# Patient Record
Sex: Male | Born: 1995 | Race: Black or African American | Hispanic: No | Marital: Single | Smoking: Current every day smoker
Health system: Southern US, Community
[De-identification: ages and names within clinical notes are randomized; demographics above are authoritative.]

## PROBLEM LIST (undated history)

## (undated) DIAGNOSIS — W3400XA Accidental discharge from unspecified firearms or gun, initial encounter: Secondary | ICD-10-CM

## (undated) DIAGNOSIS — S71139A Puncture wound without foreign body, unspecified thigh, initial encounter: Secondary | ICD-10-CM

## (undated) HISTORY — PX: LEG AMPUTATION: SHX1105

---
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
## 2018-03-30 ENCOUNTER — Emergency Department (HOSPITAL_COMMUNITY)
Admission: EM | Admit: 2018-03-30 | Discharge: 2018-03-30 | Disposition: A | Discharge status: Home / Self Care | Payer: Medicaid Other | Attending: Emergency Medicine | Admitting: Emergency Medicine

## 2018-03-30 ENCOUNTER — Encounter (HOSPITAL_COMMUNITY): Payer: Self-pay

## 2018-03-30 DIAGNOSIS — R112 Nausea with vomiting, unspecified: Secondary | ICD-10-CM | POA: Insufficient documentation

## 2018-03-30 DIAGNOSIS — R51 Headache: Secondary | ICD-10-CM | POA: Insufficient documentation

## 2018-03-30 DIAGNOSIS — F17210 Nicotine dependence, cigarettes, uncomplicated: Secondary | ICD-10-CM | POA: Diagnosis not present

## 2018-03-30 DIAGNOSIS — R0989 Other specified symptoms and signs involving the circulatory and respiratory systems: Secondary | ICD-10-CM | POA: Diagnosis not present

## 2018-03-30 DIAGNOSIS — R519 Headache, unspecified: Secondary | ICD-10-CM

## 2018-03-30 MED ORDER — SODIUM CHLORIDE 0.9 % IV BOLUS
1000.0000 mL | Freq: Once | INTRAVENOUS | Status: AC
  Administered 2018-03-30: 1000 mL via INTRAVENOUS

## 2018-03-30 MED ORDER — KETOROLAC TROMETHAMINE 15 MG/ML IJ SOLN
15.0000 mg | Freq: Once | INTRAMUSCULAR | Status: AC
Start: 2018-03-30 — End: 2018-03-30
  Administered 2018-03-30: 15 mg via INTRAVENOUS
  Filled 2018-03-30: qty 1

## 2018-03-30 MED ORDER — DIPHENHYDRAMINE HCL 50 MG/ML IJ SOLN
25.0000 mg | Freq: Once | INTRAMUSCULAR | Status: AC
  Administered 2018-03-30: 25 mg via INTRAVENOUS
  Filled 2018-03-30: qty 1

## 2018-03-30 MED ORDER — PROCHLORPERAZINE EDISYLATE 10 MG/2ML IJ SOLN
10.0000 mg | Freq: Once | INTRAMUSCULAR | Status: AC
  Administered 2018-03-30: 10 mg via INTRAVENOUS
  Filled 2018-03-30: qty 2

## 2018-03-30 NOTE — ED Notes (Signed)
Pts fluids are finished.

## 2018-03-30 NOTE — ED Provider Notes (Signed)
[facility] EMERGENCY DEPARTMENT Provider Note   CSN: [id number] Arrival date & time: 03/30/18  1222     History   Chief Complaint Chief Complaint  Patient presents with  . Headache    HPI Knox Cervi is a 22 y.o. male.  22 y/o male with no PMH presents to the ED via EMS complaining of HA which began a week and a half ago.Patient describes the headache as throbbing mainly in the frontal region of his head with no radiation.Patient also reports nausea and one episode of emesis. He also states he's had a runny nose for 2 days. Patient has tried aleve but states no relieve in symptoms. Patient denies any fever, photophobia, phonophobia, neck pain or previous history of migraines.      History reviewed. No pertinent past medical history.  There are no active problems to display for this patient.   History reviewed. No pertinent surgical history.      Home Medications    Prior to Admission medications   Not on File    Family History History reviewed. No pertinent family history.  Social History Social History   Tobacco Use  . Smoking status: Current Every Day Smoker    Packs/day: 0.10    Types: Cigarettes  . Smokeless tobacco: Never Used  Substance Use Topics  . Alcohol use: Never    Frequency: Never  . Drug use: Never     Allergies   Patient has no known allergies.   Review of Systems Review of Systems  Constitutional: Negative for fever.  HENT: Positive for rhinorrhea, sinus pressure and sinus pain. Negative for sneezing and sore throat.   Eyes: Negative for redness and visual disturbance.  Respiratory: Negative for chest tightness, shortness of breath and wheezing.   Cardiovascular: Negative for chest pain and palpitations.  Gastrointestinal: Negative for abdominal pain, diarrhea, nausea and vomiting.  Genitourinary: Negative for dysuria and flank pain.  Musculoskeletal: Negative for back pain, neck pain and neck stiffness.    Neurological: Positive for headaches. Negative for dizziness, syncope, facial asymmetry and light-headedness.  All other systems reviewed and are negative.    Physical Exam Updated Vital Signs BP (!) 117/57   Pulse 83   Temp 97.6 F (36.4 C) (Oral)   Resp 16   SpO2 100%   Physical Exam  Constitutional: He is oriented to person, place, and time. He appears well-developed and well-nourished.  HENT:  Head: Normocephalic and atraumatic.  Eyes: Pupils are equal, round, and reactive to light. EOM are normal. Pupils are equal.  Neck: Normal range of motion. No neck rigidity. No Kernig's sign noted.  Cardiovascular: Normal heart sounds.  Pulmonary/Chest: Effort normal and breath sounds normal. He has no wheezes.  Abdominal: Soft. Bowel sounds are normal. There is no tenderness.  Musculoskeletal: He exhibits no edema or tenderness.  Neurological: He is alert and oriented to person, place, and time. He has normal strength. Coordination normal.  Skin: Skin is warm and dry.  Nursing note and vitals reviewed.    ED Treatments / Results  Labs (all labs ordered are listed, but only abnormal results are displayed) Labs Reviewed - No data to display  EKG None  Radiology No results found.  Procedures Procedures (including critical care time)  Medications Ordered in ED Medications  sodium chloride 0.9 % bolus 1,000 mL (1,000 mLs Intravenous New Bag/Given 03/30/18 1347)  ketorolac (TORADOL) 15 MG/ML injection 15 mg (15 mg Intravenous Given 03/30/18 1347)  diphenhydrAMINE (BENADRYL) injection 25  mg (25 mg Intravenous Given 03/30/18 1347)  prochlorperazine (COMPAZINE) injection 10 mg (10 mg Intravenous Given 03/30/18 1347)     Initial Impression / Assessment and Plan / ED Course  I have reviewed the triage vital signs and the nursing notes.  Pertinent labs & imaging results that were available during my care of the patient were reviewed by me and considered in my medical decision  making (see chart for details).    Patient denies any fever, neck rigidity.No previous Hx of migraines.Patient neurologically intact, no facial asymmetry, no weakness. Patient denies trauma. Per [place] CT Head Rule patient does not qualify for imaging at this time.   Patient received pain medication and fluids.He states his headache is gone and he would like to go home. I have advised patient he will be discharged after fluids finish.Returend precautions were discussed with patient.  Final Clinical Impressions(s) / ED Diagnoses   Final diagnoses:  Acute nonintractable headache, unspecified headache type    ED Discharge Orders    None       Claude MangesSoto, Analeia Ismael, PA-C 03/30/18 1433    Margarita Grizzleay, Danielle, MD 03/31/18 [phone]

## 2018-03-30 NOTE — Discharge Instructions (Signed)
Please continue to hydrate with fluids and Gatorade. Return to the ED if you experience any fever, chest pain or shortness of breath.

## 2018-03-30 NOTE — ED Notes (Signed)
Pt states he has had a headache for a week with no relief from Aleve.

## 2018-03-30 NOTE — ED Triage Notes (Signed)
To triage via EMS.  Pt was walking to hospital when he had to stop and call for EMS.  Onset 1 1/2 weeks headache, throbbing, and worsening.  Taking Advil with no relief.  No cold/cough symptoms.  Vomited x 1 yesterday.

## 2018-04-01 ENCOUNTER — Emergency Department (HOSPITAL_COMMUNITY)
Admission: EM | Admit: 2018-04-01 | Discharge: 2018-04-01 | Disposition: A | Discharge status: Home / Self Care | Payer: Medicaid Other | Attending: Emergency Medicine | Admitting: Emergency Medicine

## 2018-04-01 ENCOUNTER — Encounter (HOSPITAL_COMMUNITY): Payer: Self-pay | Admitting: Emergency Medicine

## 2018-04-01 ENCOUNTER — Emergency Department (HOSPITAL_COMMUNITY): Payer: Medicaid Other

## 2018-04-01 DIAGNOSIS — F17210 Nicotine dependence, cigarettes, uncomplicated: Secondary | ICD-10-CM | POA: Diagnosis not present

## 2018-04-01 DIAGNOSIS — R51 Headache: Secondary | ICD-10-CM | POA: Diagnosis present

## 2018-04-01 DIAGNOSIS — J0110 Acute frontal sinusitis, unspecified: Secondary | ICD-10-CM | POA: Insufficient documentation

## 2018-04-01 MED ORDER — DEXAMETHASONE SODIUM PHOSPHATE 10 MG/ML IJ SOLN
10.0000 mg | Freq: Once | INTRAMUSCULAR | Status: AC
  Administered 2018-04-01: 10 mg via INTRAMUSCULAR
  Filled 2018-04-01: qty 1

## 2018-04-01 MED ORDER — IBUPROFEN 600 MG PO TABS: 600.0000 mg | ORAL_TABLET | Freq: Four times a day (QID) | ORAL | 0 refills | Status: AC | PRN

## 2018-04-01 MED ORDER — AMOXICILLIN-POT CLAVULANATE 875-125 MG PO TABS: 1.0000 | ORAL_TABLET | Freq: Two times a day (BID) | ORAL | 0 refills | Status: AC

## 2018-04-01 MED ORDER — FLUTICASONE PROPIONATE 50 MCG/ACT NA SUSP: 1.0000 | Freq: Every day | NASAL | 0 refills | Status: AC

## 2018-04-01 NOTE — ED Triage Notes (Signed)
Per [facility] pt from home for headache that started 2 weeks ago. Reports was [facility] for same complaint. Pt has swelling on forehead. Sinus complaints for several days. Pt's mother told EMS she believes it is meningitis.

## 2018-04-01 NOTE — Discharge Instructions (Addendum)
He was seen in the emergency department today for a headache and found to have acute sinusitis, otherwise noted a sinus infection.  Please see the attached handout for further information regarding this diagnosis.  We are treating this infection with Augmentin, this is an antibiotic, please take this 2 times per day for the next 7 days.  Please take all of your antibiotics until finished. You may develop abdominal discomfort or diarrhea from the antibiotic.  You may help offset this with probiotics which you can buy at the store (ask your pharmacist if unable to find) or get probiotics in the form of eating yogurt. Do not eat or take the probiotics until 2 hours after your antibiotic. If you are unable to tolerate these side effects follow-up with your primary care provider or return to the emergency department.   If you begin to experience any blistering, rashes, swelling, or difficulty breathing seek medical care for evaluation of potentially more serious side effects.   Please be aware that this medication may interact with other medications you are taking, please be sure to discuss your medication list with your pharmacist.   We are also sending you with a prescription for Flonase, this is an intranasal steroid, use this daily in both nostrils.  We also sending you with prescription for ibuprofen, you may take this every 6 hours as needed for pain.  Do not take other NSAIDs such as Motrin, Aleve, Advil, or naproxen with this medication as they are similar.  You may take Tylenol per over-the-counter dosing instructions.   Please follow-up with your primary care provider within 3 to 5 days for reevaluation, if you do not have a primary care provider you may call the number listed in your discharge instructions that is circled.  Return to the ER at anytime for new or worsening symptoms including but not limited to worsening pain, fever, pain with eye movements, inability to move your neck, or any other  concerns that you may have.

## 2018-04-01 NOTE — ED Provider Notes (Signed)
[facility]-EMERGENCY DEPT Provider Note   CSN: [id number] Arrival date & time: 04/01/18  1159     History   Chief Complaint Chief Complaint  Patient presents with  . Headache    HPI Clinton White is a 22 y.o. male with a hx of tobacco abuse who returns to the ED via EMS for headache which has been ongoing for 2 weeks. Patient describes the headache as being in the frontal region, throbbing in nature, and constant.  Patient states he wakes up each morning with a headache being mild and it gradually worsens throughout the day.  He states that he feels his forehead is somewhat swollen at times.  He is having sinus pain/pressure, congestion, and rhinorrhea.  He does report a little bit of nausea with one episode of vomiting several days ago, none at present.  He reports he is tried Aleve at home without significant relief.  He was seen in the emergency department for similar yesterday, had improvement with migraine cocktail, however this morning headache returned.  Denies fever, numbness, weakness, dizziness, change in vision, neck pain/stiffness, or abdominal pain.  Denies recent tick exposures or rashes.  HPI  History reviewed. No pertinent past medical history.  There are no active problems to display for this patient.   History reviewed. No pertinent surgical history.      Home Medications    Prior to Admission medications   Medication Sig Start Date End Date Taking? Authorizing Provider  ibuprofen (ADVIL,MOTRIN) 200 MG tablet Take 200-400 mg by mouth daily as needed for headache.   Yes [provider]    Family History No family history on file.  Social History Social History   Tobacco Use  . Smoking status: Current Every Day Smoker    Packs/day: 0.10    Types: Cigarettes  . Smokeless tobacco: Never Used  Substance Use Topics  . Alcohol use: Not Currently    Frequency: Never  . Drug use: Yes    Types: Marijuana    Comment:  everyday      Allergies   Patient has no known allergies.   Review of Systems Review of Systems  Constitutional: Negative for chills and fever.  HENT: Positive for congestion, facial swelling (forehead), rhinorrhea, sinus pressure and sinus pain. Negative for ear pain and sore throat.   Eyes: Negative for visual disturbance.  Respiratory: Negative for cough and shortness of breath.   Cardiovascular: Negative for chest pain.  Gastrointestinal: Positive for nausea (none at present) and vomiting (one episode). Negative for abdominal pain.  Neurological: Positive for headaches. Negative for dizziness, seizures, syncope, weakness and numbness.  All other systems reviewed and are negative.   Physical Exam Updated Vital Signs BP 125/70 (BP Location: Right Arm)   Pulse 78   Temp 98.3 F (36.8 C) (Oral)   Resp 18   Ht 6\' 2"  (1.88 m)   Wt 70.8 kg (156 lb)   SpO2 99%   BMI 20.03 kg/m   Physical Exam  Constitutional: He appears well-developed and well-nourished.  Non-toxic appearance. No distress.  HENT:  Head: Normocephalic and atraumatic. Head is without raccoon's eyes and without Battle's sign.  Right Ear: Tympanic membrane and ear canal normal. No mastoid tenderness. Tympanic membrane is not perforated, not erythematous, not retracted and not bulging.  Left Ear: Tympanic membrane and ear canal normal. No mastoid tenderness. Tympanic membrane is not perforated, not erythematous, not retracted and not bulging.  Nose: Mucosal edema (congestion) present. Right sinus exhibits  frontal sinus tenderness. Right sinus exhibits no maxillary sinus tenderness. Left sinus exhibits frontal sinus tenderness. Left sinus exhibits no maxillary sinus tenderness.  Mouth/Throat: Uvula is midline and oropharynx is clear and moist. No oropharyngeal exudate or posterior oropharyngeal erythema.  No appreciable swelling to the forehead  Eyes: Pupils are equal, round, and reactive to light. Conjunctivae and  EOM are normal. Right eye exhibits no discharge. Left eye exhibits no discharge.  No periorbital erythema or edema.  No proptosis.  Neck: Normal range of motion and full passive range of motion without pain. Neck supple. No neck rigidity.  Cardiovascular: Normal rate and regular rhythm.  No murmur heard. Pulmonary/Chest: Breath sounds normal. No respiratory distress. He has no wheezes. He has no rales.  Abdominal: Soft. He exhibits no distension. There is no tenderness.  Lymphadenopathy:    He has no cervical adenopathy.  Neurological: He is alert.   Clear speech. No facial droop. CNIII-XII grossly intact. Bilateral upper and lower extremities' sensation grossly intact. 5/5 symmetric strength with grip strength and with plantar and dorsi flexion bilaterally. Patellar DTRs are 2+ and symmetric . Normal finger to nose bilaterally. Negative pronator drift. Negative Romberg sign. Gait is steady and intact.   Skin: Skin is warm and dry. No rash noted.  Psychiatric: He has a normal mood and affect. His behavior is normal. Thought content normal.  Nursing note and vitals reviewed.    ED Treatments / Results  Labs (all labs ordered are listed, but only abnormal results are displayed) Labs Reviewed - No data to display  EKG None  Radiology Ct Head Wo Contrast  Result Date: 04/01/2018 CLINICAL DATA:  Headache for 2 weeks. Forehead swelling. Sinus pain. EXAM: CT HEAD WITHOUT CONTRAST TECHNIQUE: Contiguous axial images were obtained from the base of the skull through the vertex without intravenous contrast. COMPARISON:  None. FINDINGS: BRAIN: No intraparenchymal hemorrhage, mass effect nor midline shift. The ventricles and sulci are normal. No acute large vascular territory infarcts. No abnormal extra-axial fluid collections. Basal cisterns are patent. VASCULAR: Unremarkable. SKULL/SOFT TISSUES: No skull fracture. No significant soft tissue swelling. ORBITS/SINUSES: The included ocular globes and  orbital contents are normal.Soft tissue completely opacifies the frontal sinuses, intact inner and outer tables. Moderate LEFT greater than RIGHT ethmoid mucosal thickening. Pneumatized petrous apices, well aerated mastoid air cells. OTHER: None. IMPRESSION: 1. Normal noncontrast CT HEAD. 2. Severe frontal sinusitis. Electronically Signed   By: Awilda Metroourtnay  Bloomer M.D.   On: 04/01/2018 18:35    Procedures Procedures (including critical care time)  Medications Ordered in ED Medications  dexamethasone (DECADRON) injection 10 mg (has no administration in time range)    Initial Impression / Assessment and Plan / ED Course  I have reviewed the triage vital signs and the nursing notes.  Pertinent labs & imaging results that were available during my care of the patient were reviewed by me and considered in my medical decision making (see chart for details).   Patient presents to the emergency department with complaint of headache and associated sinus pain/pressure and congestion.  Patient nontoxic-appearing, no apparent distress, vitals WNL.  Patient does have frontal sinus tenderness to palpation with mucosal edema on exam raising suspicion for acute frontal sinusitis as etiology.  Headache had gradual onset with steady progression. Patient is without focal neuro deficits, dizziness, change in vision, proptosis non concerning for [facility], ICH, ischemic CVA, dural venous sinus thrombosis, acute glaucoma, or giant cell arteritis. CT scan obtained given has not necessarily had a hx  of similar headache and patient concern- this was negative for acute intra-cranial abnormalities, there are findings that confirm severe frontal sinusitis. EOMI, no proptosis, no periorbital edema/erythema to raise concern for orbital cellulitis. No meningeal signs on exam. Decadron in the ER, discharge home with Augmentin, Flonase, and Ibuprofen. I discussed results, treatment plan, need for PCP follow-up, and return precautions with  the patient. Provided opportunity for questions, patient confirmed understanding and is in agreement with plan.   Findings and plan of care discussed with supervising physician Dr.Isaacs who is in agreement with plan.   Final Clinical Impressions(s) / ED Diagnoses   Final diagnoses:  Acute non-recurrent frontal sinusitis    ED Discharge Orders        Ordered    amoxicillin-clavulanate (AUGMENTIN) 875-125 MG tablet  Every 12 hours     04/01/18 1900    fluticasone (FLONASE) 50 MCG/ACT nasal spray  Daily     04/01/18 1900    ibuprofen (ADVIL,MOTRIN) 600 MG tablet  Every 6 hours PRN     04/01/18 1900       Jamine Highfill, Pleas Koch, PA-C 04/01/18 2315    Shaune Pollack, MD 04/02/18 0004

## 2018-06-07 ENCOUNTER — Telehealth: Payer: Self-pay | Admitting: *Deleted

## 2018-06-07 NOTE — Telephone Encounter (Signed)
Post ED Visit - Positive Culture Follow-up  Culture report reviewed by antimicrobial stewardship pharmacist:  []  Enzo BiNathan Batchelder, Pharm.D. []  Celedonio MiyamotoJeremy Frens, Pharm.D., BCPS AQ-ID []  Garvin FilaMike Maccia, Pharm.D., BCPS []  Georgina PillionElizabeth Martin, Pharm.D., BCPS [x]  ChaparralMinh Pham, [address] BoulevardPharm.D., BCPS, AAHIVP []  Estella HuskMichelle Turner, Pharm.D., BCPS, AAHIVP []  Lysle Pearlachel Rumbarger, PharmD, BCPS []  Phillips Climeshuy Dang, PharmD, BCPS []  Agapito GamesAlison Masters, PharmD, BCPS []  Verlan FriendsErin Deja, PharmD  Positive wound culture Treated with Sulfamethoxazole-Trimethoprim, organism sensitive to the same and no further patient follow-up is required at this time.  Virl AxeRobertson, Lachandra Dettmann [facility] 06/07/2018, 11:21 AM

## 2019-03-08 ENCOUNTER — Emergency Department (HOSPITAL_COMMUNITY)
Admission: EM | Admit: 2019-03-08 | Discharge: 2019-03-08 | Disposition: A | Discharge status: Home / Self Care | Payer: Medicaid Other | Attending: Emergency Medicine | Admitting: Emergency Medicine

## 2019-03-08 DIAGNOSIS — Z433 Encounter for attention to colostomy: Secondary | ICD-10-CM | POA: Diagnosis present

## 2019-03-08 DIAGNOSIS — R300 Dysuria: Secondary | ICD-10-CM | POA: Diagnosis not present

## 2019-03-08 DIAGNOSIS — F17210 Nicotine dependence, cigarettes, uncomplicated: Secondary | ICD-10-CM | POA: Diagnosis not present

## 2019-03-08 LAB — URINALYSIS, ROUTINE W REFLEX MICROSCOPIC
Bilirubin Urine: NEGATIVE
Glucose, UA: NEGATIVE mg/dL
Hgb urine dipstick: NEGATIVE
Ketones, ur: NEGATIVE mg/dL
Leukocytes,Ua: NEGATIVE
Nitrite: NEGATIVE
Protein, ur: NEGATIVE mg/dL
Specific Gravity, Urine: 1.021 (ref 1.005–1.030)
pH: 6 (ref 5.0–8.0)

## 2019-03-08 NOTE — ED Triage Notes (Signed)
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 Pt states he is out of bags and ordered some more colostomy bags but he will not have them for another "3-4 days."

## 2019-03-08 NOTE — Discharge Instructions (Addendum)
Colostomy bags were provided today during your visit.  Your urine showed no signs of infection, it was sent for culture if any organism is to grow will call for antibiotic therapy.  Please follow-up with your primary care physician.  If you do not have a primary care physician the [facility] health and wellness clinic's number attached to the your chart.

## 2019-03-08 NOTE — ED Provider Notes (Signed)
[place] EMERGENCY DEPARTMENT Provider Note   CSN: [id number] Arrival date & time: 03/08/19  1950    History   Chief Complaint Chief Complaint  Patient presents with  . needs colostomy bag    HPI Clinton White is a 23 y.o. male.     23 y.o male with a PMH of open wound to the abdomen presents to the ED requesting a a colostomy bag replacement. 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 He reports running out of his colostomy bag and will not be getting a replacement for 3-4 days. Patient also reports dysuria, states this began a couple of days ago. He denies any fever or other complaints.          Home Medications    Prior to Admission medications   Medication Sig Start Date End Date Taking? Authorizing Provider  amoxicillin-clavulanate (AUGMENTIN) 875-125 MG tablet Take 1 tablet by mouth every 12 (twelve) hours. 04/01/18   Petrucelli, Samantha R, PA-C  fluticasone (FLONASE) 50 MCG/ACT nasal spray Place 1 spray into both nostrils daily. 04/01/18   Petrucelli, Samantha R, PA-C  ibuprofen (ADVIL,MOTRIN) 600 MG tablet Take 1 tablet (600 mg total) by mouth every 6 (six) hours as needed. 04/01/18   Petrucelli, Glynda Jaeger, PA-C    Family History No family history on file.  Social History Social History   Tobacco Use  . Smoking status: Current Every Day Smoker    Packs/day: 0.10    Types: Cigarettes  . Smokeless tobacco: Never Used  Substance Use Topics  . Alcohol use: Not Currently    Frequency: Never  . Drug use: Yes    Types: Marijuana    Comment: everyday      Allergies   Patient has no known allergies.   Review of Systems Review of Systems  Constitutional: Negative for fever.  Genitourinary: Positive for dysuria. Negative for flank pain, genital sores, hematuria, penile swelling, scrotal swelling and testicular pain.  Skin: Negative for pallor and wound.     Physical Exam  Updated Vital Signs BP 122/76 (BP Location: Right Arm)   Pulse 82   Temp 97.7 F (36.5 C) (Oral)   Resp 16   SpO2 99%   Physical Exam Vitals signs and nursing note reviewed.  Constitutional:      Appearance: He is well-developed.  HENT:     Head: Normocephalic and atraumatic.  Eyes:     General: No scleral icterus.    Pupils: Pupils are equal, round, and reactive to light.  Neck:     Musculoskeletal: Normal range of motion.  Cardiovascular:     Rate and Rhythm: Normal rate and regular rhythm.     Heart sounds: Normal heart sounds.  Pulmonary:     Effort: Pulmonary effort is normal.     Breath sounds: Normal breath sounds. No wheezing.  Chest:     Chest wall: No tenderness.  Abdominal:     General: Bowel sounds are normal. There is no distension.     Palpations: Abdomen is soft.     Tenderness: There is no abdominal tenderness.       Comments: No erythema, changes in skin.  Musculoskeletal:        General: No tenderness or deformity.  Skin:    General: Skin is warm and dry.  Neurological:     Mental Status: He is alert and oriented to person, place, and time.  ED Treatments / Results  Labs (all labs ordered are listed, but only abnormal results are displayed) Labs Reviewed  URINE CULTURE  URINALYSIS, ROUTINE W REFLEX MICROSCOPIC    EKG None  Radiology No results found.  Procedures Procedures (including critical care time)  Medications Ordered in ED Medications - No data to display   Initial Impression / Assessment and Plan / ED Course  I have reviewed the triage vital signs and the nursing notes.  Pertinent labs & imaging results that were available during my care of the patient were reviewed by me and considered in my medical decision making (see chart for details).      Patient with a past medical history of open wound to the abdomen presents to the ED requesting ileostomy supplies.  [facility], he has  run out of ileostomy bags, reports he will not be receiving them for 3 to 4 days.  During evaluation patient is well-appearing, site is identified without erythema, other changes.  Will obtain supplies for patient, as I was walking out the door patient reported urinary symptoms, reports some dysuria and discomfort for the past couple days.  According to his medical records he has a previous history of UTIs.  Will obtain UA along with culture to further evaluate.  UA showed no nitrites, leukocytes, white blood cell count.  Urine was sent for culture.  Patient informed of his results.  Patient [place], provided with the [place] and wellness number.  Return precautions provided at length.     Portions of this note were generated with Scientist, clinical (histocompatibility and immunogenetics)Dragon dictation software. Dictation errors may occur despite best attempts at proofreading.   Final Clinical Impressions(s) / ED Diagnoses   Final diagnoses:  Colostomy care [facility](HCC)    ED Discharge Orders    None       Claude MangesSoto, Blanca Thornton, PA-C 03/08/19 1144    Gerhard MunchLockwood, Robert, MD 03/08/19 1336

## 2019-03-09 LAB — URINE CULTURE: Culture: NO GROWTH

## 2019-04-13 ENCOUNTER — Encounter (HOSPITAL_COMMUNITY): Payer: Self-pay | Admitting: Emergency Medicine

## 2019-04-13 ENCOUNTER — Emergency Department (HOSPITAL_COMMUNITY)
Admission: EM | Admit: 2019-04-13 | Discharge: 2019-04-13 | Disposition: A | Discharge status: Home / Self Care | Payer: Medicaid Other | Attending: Emergency Medicine | Admitting: Emergency Medicine

## 2019-04-13 ENCOUNTER — Other Ambulatory Visit: Payer: Self-pay

## 2019-04-13 DIAGNOSIS — X58XXXA Exposure to other specified factors, initial encounter: Secondary | ICD-10-CM | POA: Insufficient documentation

## 2019-04-13 DIAGNOSIS — T148XXA Other injury of unspecified body region, initial encounter: Secondary | ICD-10-CM

## 2019-04-13 DIAGNOSIS — F17210 Nicotine dependence, cigarettes, uncomplicated: Secondary | ICD-10-CM | POA: Insufficient documentation

## 2019-04-13 DIAGNOSIS — Y9289 Other specified places as the place of occurrence of the external cause: Secondary | ICD-10-CM | POA: Diagnosis not present

## 2019-04-13 DIAGNOSIS — M25552 Pain in left hip: Secondary | ICD-10-CM | POA: Diagnosis present

## 2019-04-13 DIAGNOSIS — Y9389 Activity, other specified: Secondary | ICD-10-CM | POA: Insufficient documentation

## 2019-04-13 DIAGNOSIS — Y999 Unspecified external cause status: Secondary | ICD-10-CM | POA: Insufficient documentation

## 2019-04-13 DIAGNOSIS — S70222A Blister (nonthermal), left hip, initial encounter: Secondary | ICD-10-CM | POA: Diagnosis not present

## 2019-04-13 HISTORY — DX: Puncture wound without foreign body, unspecified thigh, initial encounter: S71.139A

## 2019-04-13 LAB — CBC WITH DIFFERENTIAL/PLATELET
Abs Immature Granulocytes: 0.01 10*3/uL (ref 0.00–0.07)
Basophils Absolute: 0 10*3/uL (ref 0.0–0.1)
Basophils Relative: 1 %
Eosinophils Absolute: 0.2 10*3/uL (ref 0.0–0.5)
Eosinophils Relative: 3 %
HCT: 44.2 % (ref 39.0–52.0)
Hemoglobin: 14.5 g/dL (ref 13.0–17.0)
Immature Granulocytes: 0 %
Lymphocytes Relative: 51 %
Lymphs Abs: 2.6 10*3/uL (ref 0.7–4.0)
MCH: 31.5 pg (ref 26.0–34.0)
MCHC: 32.8 g/dL (ref 30.0–36.0)
MCV: 96.1 fL (ref 80.0–100.0)
Monocytes Absolute: 0.3 10*3/uL (ref 0.1–1.0)
Monocytes Relative: 6 %
Neutro Abs: 2 10*3/uL (ref 1.7–7.7)
Neutrophils Relative %: 39 %
Platelets: 188 10*3/uL (ref 150–400)
RBC: 4.6 MIL/uL (ref 4.22–5.81)
RDW: 12.7 % (ref 11.5–15.5)
WBC: 5.2 10*3/uL (ref 4.0–10.5)
nRBC: 0 % (ref 0.0–0.2)

## 2019-04-13 LAB — BASIC METABOLIC PANEL
Anion gap: 10 (ref 5–15)
BUN: 7 mg/dL (ref 6–20)
CO2: 22 mmol/L (ref 22–32)
Calcium: 8.9 mg/dL (ref 8.9–10.3)
Chloride: 106 mmol/L (ref 98–111)
Creatinine, Ser: 1.11 mg/dL (ref 0.61–1.24)
GFR calc Af Amer: >60 mL/min (ref >60)
GFR calc non Af Amer: >60 mL/min (ref >60)
Glucose, Bld: 91 mg/dL (ref 70–99)
Potassium: 3.4 mmol/L — ABNORMAL LOW (ref 3.5–5.1)
Sodium: 138 mmol/L (ref 135–145)
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
## 2019-04-13 NOTE — ED Triage Notes (Signed)
Pt states he is 7/10 hip pain, pt states it feel a lot of pressure there and he believe is a blood cloth on his incision.

## 2019-12-25 IMAGING — CT CT HEAD W/O CM
3 series · 15 of 47 positions shown, 18 images · non-contrast
Comparison: None.

CLINICAL DATA: Headache for 2 weeks. Forehead swelling. Sinus pain.

EXAM:
CT HEAD WITHOUT CONTRAST
TECHNIQUE: Contiguous axial images were obtained from the base of the skull
through the vertex without intravenous contrast.

[Series 2: head wo · axial · 0.47mm/px · z∈[-109,+16]mm · 9 of 30 slices shown, 12 images]
[im 3/30  brain]
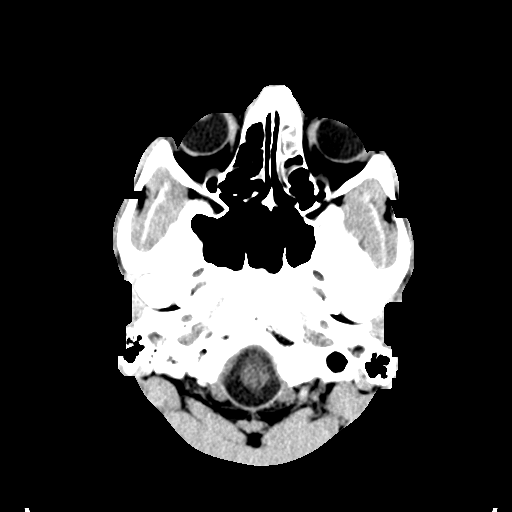
[im 3/30  bone]
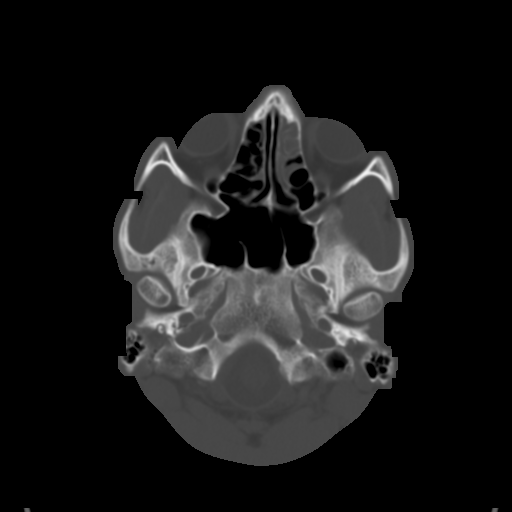
[im 6/30  brain]
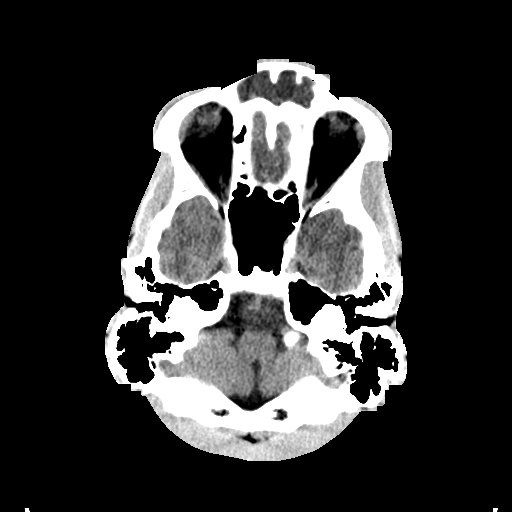
[im 9/30  brain]
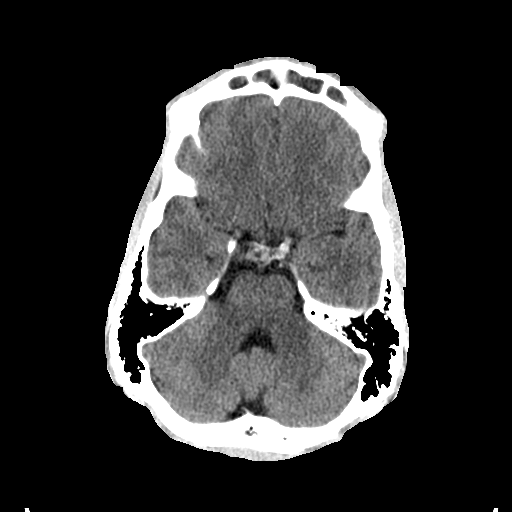
[im 12/30  brain]
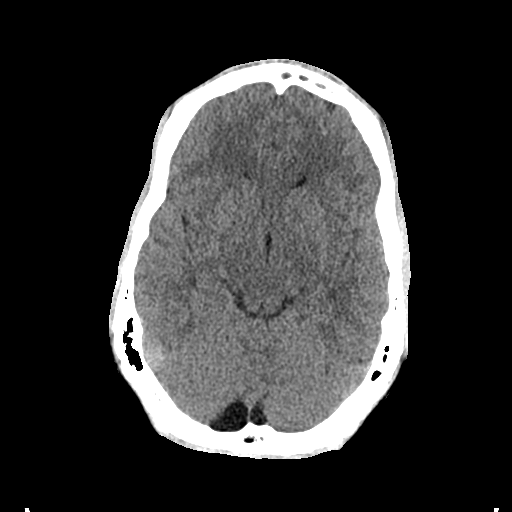
[im 16/30  brain]
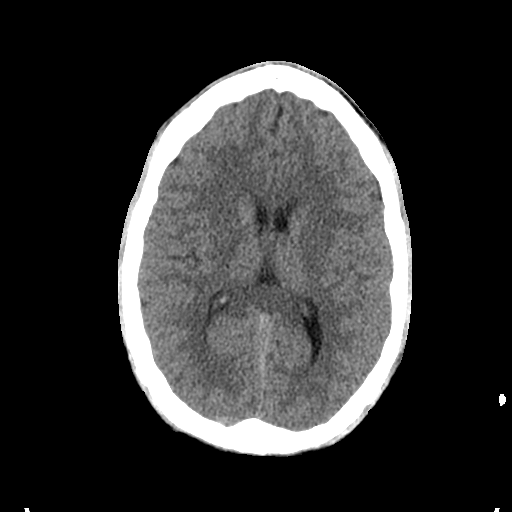
[im 16/30  bone]
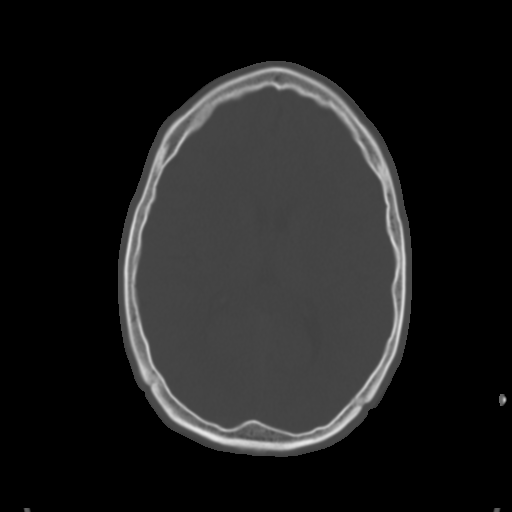
[im 19/30  brain]
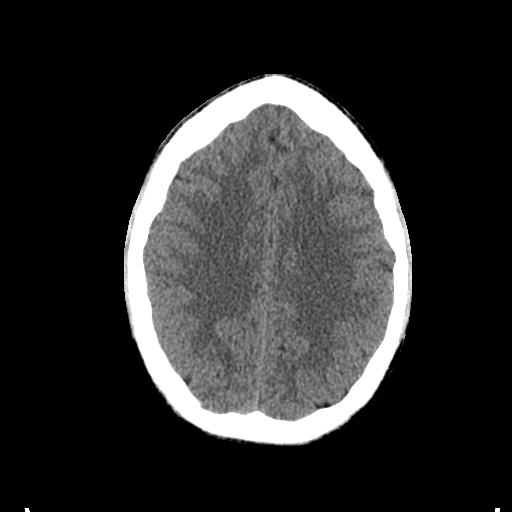
[im 22/30  brain]
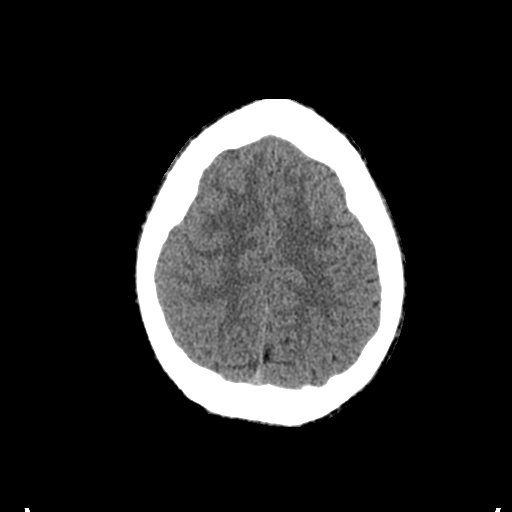
[im 25/30  brain]
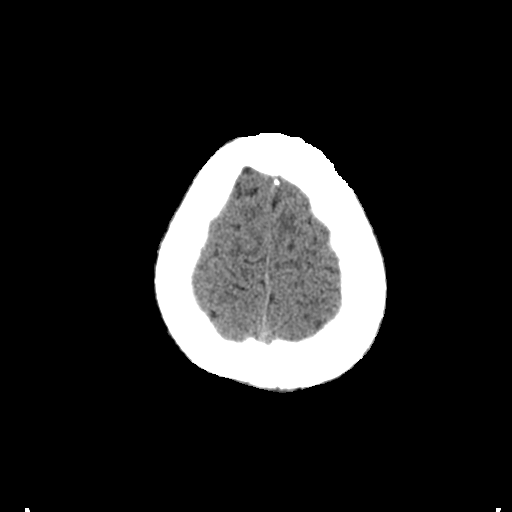
[im 28/30  brain]
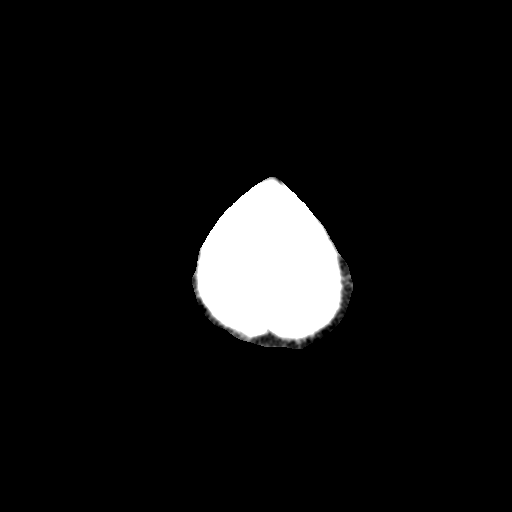
[im 28/30  bone]
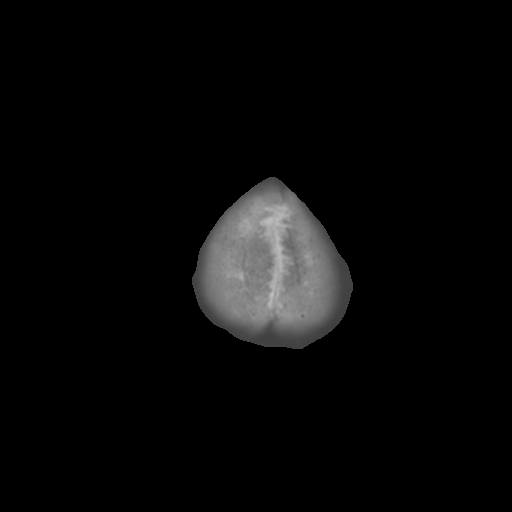

[Series 5: coronal soft tissue · coronal · 0.29mm/px · 3 of 70 slices shown]
[im 24/70  brain]
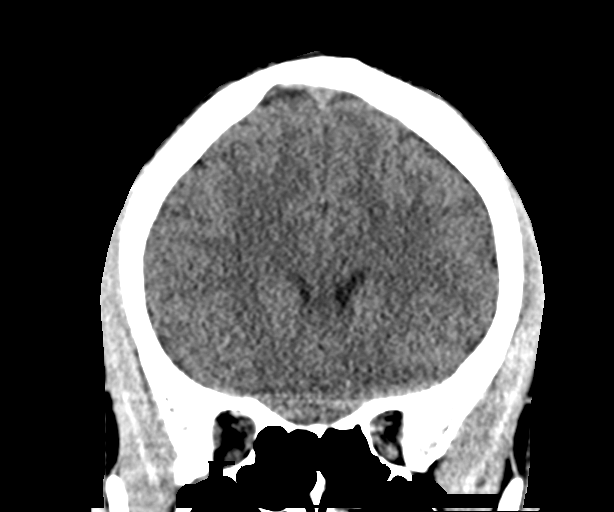
[im 31/70  brain]
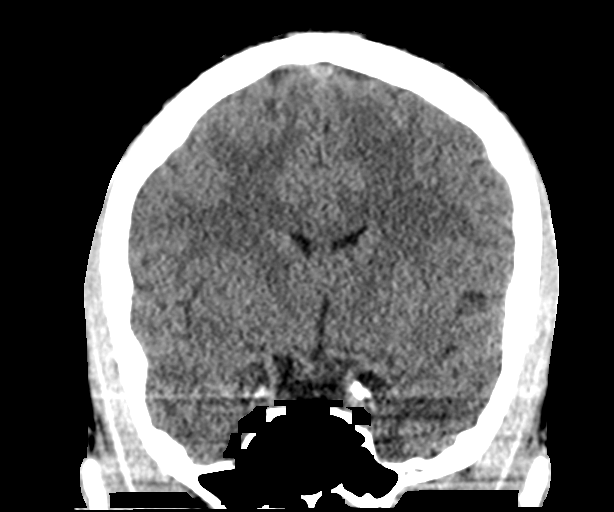
[im 39/70  brain]
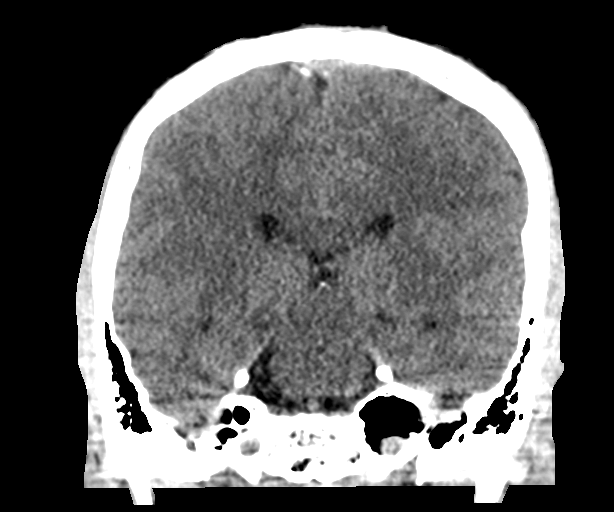

[Series 6: sagittal soft tissue · sagittal · 0.29mm/px · 3 of 61 slices shown]
[im 21/61  brain]
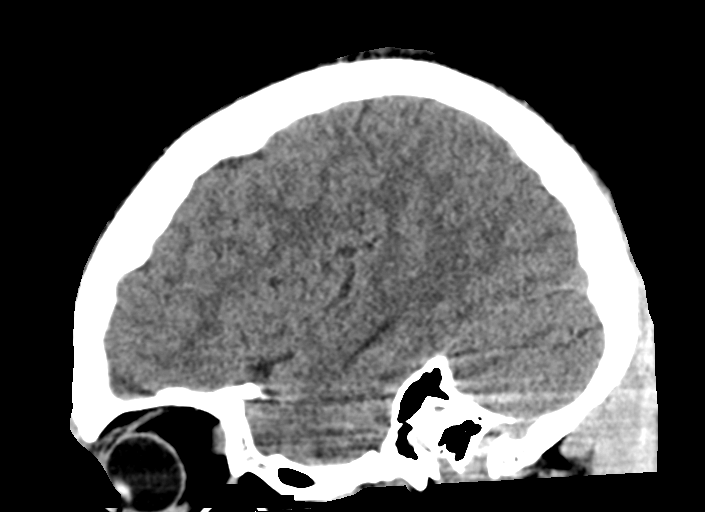
[im 31/61  brain]
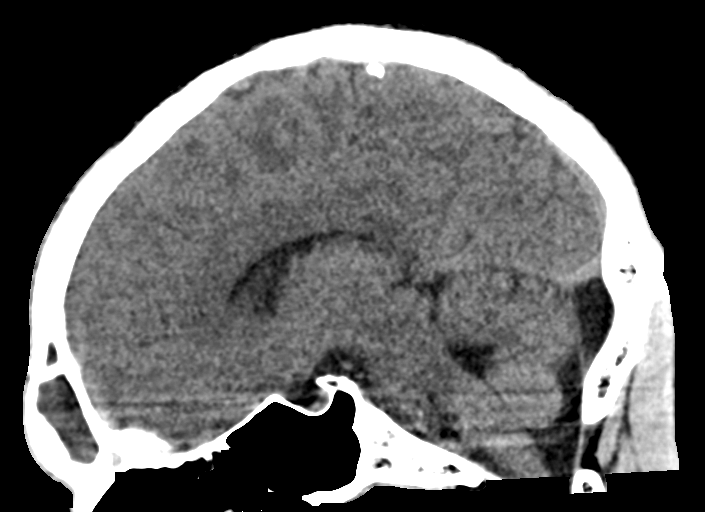
[im 41/61  brain]
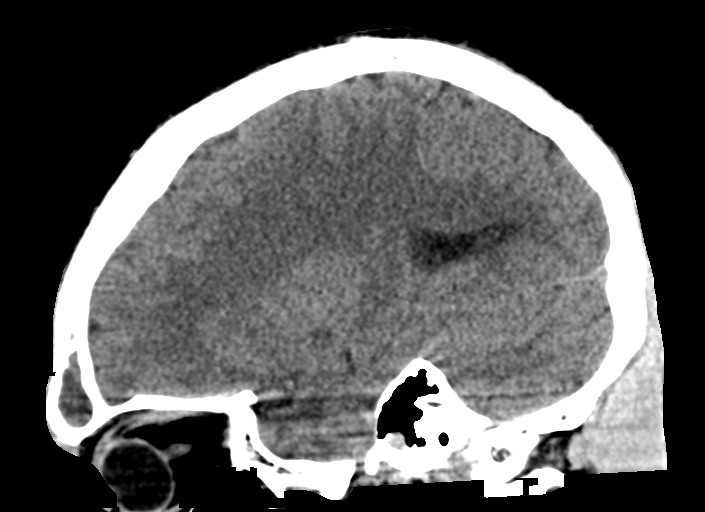

[15 of 47 positions shown; findings below may reference images not displayed]

FINDINGS: BRAIN: No intraparenchymal hemorrhage, mass effect nor midline
shift. The ventricles and sulci are normal. No acute large vascular
territory infarcts. No abnormal extra-axial fluid collections. Basal
cisterns are patent.

VASCULAR: Unremarkable.

SKULL/SOFT TISSUES: No skull fracture. No significant soft tissue
swelling.

ORBITS/SINUSES: The included ocular globes and orbital contents are
normal.Soft tissue completely opacifies the frontal sinuses, intact
inner and outer tables. Moderate LEFT greater than RIGHT ethmoid
mucosal thickening. Pneumatized petrous apices, well aerated mastoid
air cells.

OTHER: None.
IMPRESSION: 1. Normal noncontrast CT HEAD.
2. Severe frontal sinusitis.
